# Patient Record
Sex: Female | Born: 1995 | Race: Black or African American | Hispanic: No | Marital: Single | Smoking: Never smoker
Health system: Southern US, Community
[De-identification: ages and names within clinical notes are randomized; demographics above are authoritative.]

## PROBLEM LIST (undated history)

## (undated) ENCOUNTER — Inpatient Hospital Stay (HOSPITAL_COMMUNITY): Payer: Self-pay

## (undated) DIAGNOSIS — E669 Obesity, unspecified: Secondary | ICD-10-CM

## (undated) HISTORY — PX: NO PAST SURGERIES: SHX2092

---

## 2013-03-13 ENCOUNTER — Emergency Department (HOSPITAL_COMMUNITY): Payer: Medicaid Other

## 2013-03-13 ENCOUNTER — Emergency Department (HOSPITAL_COMMUNITY)
Admission: EM | Admit: 2013-03-13 | Discharge: 2013-03-14 | Disposition: A | Discharge status: Home / Self Care | Payer: Medicaid Other | Attending: Emergency Medicine | Admitting: Emergency Medicine

## 2013-03-13 ENCOUNTER — Encounter (HOSPITAL_COMMUNITY): Payer: Self-pay | Admitting: Emergency Medicine

## 2013-03-13 DIAGNOSIS — O200 Threatened abortion: Secondary | ICD-10-CM

## 2013-03-13 DIAGNOSIS — E669 Obesity, unspecified: Secondary | ICD-10-CM | POA: Insufficient documentation

## 2013-03-13 HISTORY — DX: Obesity, unspecified: E66.9

## 2013-03-13 LAB — CBC WITH DIFFERENTIAL/PLATELET
Basophils Relative: 0 % (ref 0–1)
Eosinophils Absolute: 0.1 10*3/uL (ref 0.0–0.7)
Eosinophils Relative: 1 % (ref 0–5)
HCT: 35.4 % — ABNORMAL LOW (ref 36.0–46.0)
Hemoglobin: 12.3 g/dL (ref 12.0–15.0)
Lymphs Abs: 1.9 10*3/uL (ref 0.7–4.0)
MCH: 29.8 pg (ref 26.0–34.0)
MCHC: 34.7 g/dL (ref 30.0–36.0)
MCV: 85.7 fL (ref 78.0–100.0)
Monocytes Absolute: 0.5 10*3/uL (ref 0.1–1.0)
Monocytes Relative: 6 % (ref 3–12)
Neutrophils Relative %: 69 % (ref 43–77)
RBC: 4.13 MIL/uL (ref 3.87–5.11)

## 2013-03-13 LAB — URINALYSIS, ROUTINE W REFLEX MICROSCOPIC
Bilirubin Urine: NEGATIVE
Glucose, UA: NEGATIVE mg/dL
Ketones, ur: NEGATIVE mg/dL
Protein, ur: 30 mg/dL — AB
pH: 6.5 (ref 5.0–8.0)

## 2013-03-13 LAB — COMPREHENSIVE METABOLIC PANEL
AST: 17 U/L (ref 0–37)
Albumin: 3.6 g/dL (ref 3.5–5.2)
Alkaline Phosphatase: 61 U/L (ref 39–117)
BUN: 8 mg/dL (ref 6–23)
Creatinine, Ser: 0.62 mg/dL (ref 0.50–1.10)
GFR calc Af Amer: >90 mL/min (ref >90)
Glucose, Bld: 79 mg/dL (ref 70–99)
Potassium: 3.7 mEq/L (ref 3.5–5.1)
Total Bilirubin: 0.3 mg/dL (ref 0.3–1.2)
Total Protein: 7.5 g/dL (ref 6.0–8.3)

## 2013-03-13 LAB — POCT PREGNANCY, URINE: Preg Test, Ur: POSITIVE — AB

## 2013-03-13 LAB — URINE MICROSCOPIC-ADD ON

## 2013-03-13 LAB — HCG, QUANTITATIVE, PREGNANCY: hCG, Beta Chain, Quant, S: 12060 m[IU]/mL — ABNORMAL HIGH (ref <5)

## 2013-03-13 LAB — ABO/RH: ABO/RH(D): A POS

## 2013-03-13 NOTE — ED Notes (Signed)
Pt. reports vaginal spotting this evening with low abdominal cramping , pt. stated she is [redacted] weeks pregnant , LMP - 02/07/2013 ( G1P0 ) , no prenatal visits.

## 2013-03-13 NOTE — ED Provider Notes (Signed)
CSN: [id number]     Arrival date & time 03/13/13  1948 History   First MD Initiated Contact with Patient 03/13/13 2122     Chief Complaint  Patient presents with  . Vaginal Bleeding   (Consider location/radiation/quality/duration/timing/severity/associated sxs/prior Treatment) HPI Comments: Patient presents emergency department with chief complaint of vaginal bleeding. She states that she is about [redacted] weeks pregnant. This is her first pregnancy. She has not had any prenatal visits. She states that she started having some cramping and bleeding tonight. States the pain is moderate. She has not tried taking anything to alleviate her symptoms. Nothing makes her symptoms better or worse. She denies chest pain, shortness of breath, nausea, vomiting, diarrhea, or constipation. Denies any dysuria.  The history is provided by the patient. No language interpreter was used.    Past Medical History  Diagnosis Date  . Obesity    History reviewed. No pertinent past surgical history. No family history on file. History  Substance Use Topics  . Smoking status: Never Smoker   . Smokeless tobacco: Not on file  . Alcohol Use: No   OB History   Grav Para Term Preterm Abortions TAB SAB Ect Mult Living                 Review of Systems  All other systems reviewed and are negative.    Allergies  Review of patient's allergies indicates no known allergies.  Home Medications  No current outpatient prescriptions on file. BP 104/61  Pulse 89  Temp(Src) 98.5 F (36.9 C) (Oral)  Resp 16  Ht 5\' 5"  (1.651 m)  Wt 261 lb (118.389 kg)  BMI 43.43 kg/m2  SpO2 100%  LMP 02/07/2013 Physical Exam  Nursing note and vitals reviewed. Constitutional: She is oriented to person, place, and time. She appears well-developed and well-nourished.  HENT:  Head: Normocephalic and atraumatic.  Eyes: Conjunctivae and EOM are normal. Pupils are equal, round, and reactive to light.  Neck: Normal range of motion. Neck  supple.  Cardiovascular: Normal rate and regular rhythm.  Exam reveals no gallop and no friction rub.   No murmur heard. Pulmonary/Chest: Effort normal and breath sounds normal. No respiratory distress. She has no wheezes. She has no rales. She exhibits no tenderness.  Abdominal: Soft. Bowel sounds are normal. She exhibits no distension and no mass. There is no tenderness. There is no rebound and no guarding.  Musculoskeletal: Normal range of motion. She exhibits no edema and no tenderness.  Neurological: She is alert and oriented to person, place, and time.  Skin: Skin is warm and dry.  Psychiatric: She has a normal mood and affect. Her behavior is normal. Judgment and thought content normal.  Exam performed by Roxy Horseman,  exam chaperoned Date: 03/14/2013 Pelvic exam: normal external genitalia without evidence of trauma. VULVA: normal appearing vulva with no masses, tenderness or lesion. VAGINA: normal appearing vagina with normal color and discharge, no lesions. CERVIX: normal appearing cervix without lesions, cervical motion tenderness absent, cervical os closed with some purulent discharge/vaginal discharge  Wet prep and DNA probe for chlamydia and GC obtained.   ADNEXA: normal adnexa in size, nontender and no masses UTERUS: uterus is normal size, shape, consistency and nontender.    ED Course  Procedures (including critical care time) Results for orders placed during the hospital encounter of 03/13/13  URINALYSIS, ROUTINE W REFLEX MICROSCOPIC      Result Value Range   Color, Urine YELLOW  YELLOW   APPearance CLOUDY (*)  CLEAR   Specific Gravity, Urine 1.030  1.005 - 1.030   pH 6.5  5.0 - 8.0   Glucose, UA NEGATIVE  NEGATIVE mg/dL   Hgb urine dipstick NEGATIVE  NEGATIVE   Bilirubin Urine NEGATIVE  NEGATIVE   Ketones, ur NEGATIVE  NEGATIVE mg/dL   Protein, ur 30 (*) NEGATIVE mg/dL   Urobilinogen, UA 1.0  0.0 - 1.0 mg/dL   Nitrite NEGATIVE  NEGATIVE   Leukocytes, UA LARGE  (*) NEGATIVE  HCG, QUANTITATIVE, PREGNANCY      Result Value Range   hCG, Beta Chain, Quant, S 12060 (*) <5 mIU/mL  CBC WITH DIFFERENTIAL      Result Value Range   WBC 7.8  4.0 - 10.5 K/uL   RBC 4.13  3.87 - 5.11 MIL/uL   Hemoglobin 12.3  12.0 - 15.0 g/dL   HCT 16.1 (*) 09.6 - 04.5 %   MCV 85.7  78.0 - 100.0 fL   MCH 29.8  26.0 - 34.0 pg   MCHC 34.7  30.0 - 36.0 g/dL   RDW 40.9  81.1 - 91.4 %   Platelets 252  150 - 400 K/uL   Neutrophils Relative % 69  43 - 77 %   Neutro Abs 5.3  1.7 - 7.7 K/uL   Lymphocytes Relative 24  12 - 46 %   Lymphs Abs 1.9  0.7 - 4.0 K/uL   Monocytes Relative 6  3 - 12 %   Monocytes Absolute 0.5  0.1 - 1.0 K/uL   Eosinophils Relative 1  0 - 5 %   Eosinophils Absolute 0.1  0.0 - 0.7 K/uL   Basophils Relative 0  0 - 1 %   Basophils Absolute 0.0  0.0 - 0.1 K/uL  COMPREHENSIVE METABOLIC PANEL      Result Value Range   Sodium 136  135 - 145 mEq/L   Potassium 3.7  3.5 - 5.1 mEq/L   Chloride 103  96 - 112 mEq/L   CO2 22  19 - 32 mEq/L   Glucose, Bld 79  70 - 99 mg/dL   BUN 8  6 - 23 mg/dL   Creatinine, Ser 7.82  0.50 - 1.10 mg/dL   Calcium 9.0  8.4 - 95.6 mg/dL   Total Protein 7.5  6.0 - 8.3 g/dL   Albumin 3.6  3.5 - 5.2 g/dL   AST 17  0 - 37 U/L   ALT 21  0 - 35 U/L   Alkaline Phosphatase 61  39 - 117 U/L   Total Bilirubin 0.3  0.3 - 1.2 mg/dL   GFR calc non Af Amer >90  >90 mL/min   GFR calc Af Amer >90  >90 mL/min  URINE MICROSCOPIC-ADD ON      Result Value Range   Squamous Epithelial / LPF MANY (*) RARE   WBC, UA 21-50  <3 WBC/hpf   RBC / HPF 3-6  <3 RBC/hpf   Bacteria, UA MANY (*) RARE   Sperm, UA PRESENT     Urine-Other MUCOUS PRESENT    POCT PREGNANCY, URINE      Result Value Range   Preg Test, Ur POSITIVE (*) NEGATIVE  ABO/RH      Result Value Range   ABO/RH(D) A POS     No rh immune globuloin NOT A RH IMMUNE GLOBULIN CANDIDATE, PT RH POSITIVE     US Ob Comp Less 14 Wks  03/13/2013   CLINICAL DATA:  Vaginal bleeding.  EXAM:  TRANSVAGINAL OB ULTRASOUND; OBSTETRIC <  14 WK ULTRASOUND  TECHNIQUE: Transvaginal ultrasound was performed for complete evaluation of the gestation as well as the maternal uterus, adnexal regions, and pelvic cul-de-sac.  COMPARISON:  No priors.  FINDINGS: Intrauterine gestational sac: Single ovoid shaped gestational sac in the fundal portion of the endometrial cavity.  Yolk sac:  Present.  Embryo:  Present.  Cardiac Activity: Present.  Heart Rate: 85 bpm  MSD:   mm    w     d  CRL:   2.3  mm   5 w 5 d                  [place] EDC: 11/08/2013  Maternal uterus/adnexae: No evidence of subchorionic hemorrhage at this time. Probable degenerating corpus luteum cyst in the right ovary incidentally noted. Left ovary is unremarkable in appearance. Small volume of free fluid in the cul-de-sac.  IMPRESSION: 1. Single viable IUP with estimated gestational age of [redacted] weeks and 5 days and fetal heart rate of 85 beats per min. 2. Probable degenerating corpus luteum cyst in the right ovary incidentally noted. 3. Small volume of free fluid in the cul-de-sac is presumably physiologic.   Electronically Signed   By: Trudie Reed M.D.   On: 03/13/2013 22:47   US Ob Transvaginal  03/13/2013   CLINICAL DATA:  Vaginal bleeding.  EXAM: TRANSVAGINAL OB ULTRASOUND; OBSTETRIC <14 WK ULTRASOUND  TECHNIQUE: Transvaginal ultrasound was performed for complete evaluation of the gestation as well as the maternal uterus, adnexal regions, and pelvic cul-de-sac.  COMPARISON:  No priors.  FINDINGS: Intrauterine gestational sac: Single ovoid shaped gestational sac in the fundal portion of the endometrial cavity.  Yolk sac:  Present.  Embryo:  Present.  Cardiac Activity: Present.  Heart Rate: 85 bpm  MSD:   mm    w     d  CRL:   2.3  mm   5 w 5 d                  [place] EDC: 11/08/2013  Maternal uterus/adnexae: No evidence of subchorionic hemorrhage at this time. Probable degenerating corpus luteum cyst in the right ovary incidentally noted. Left ovary is  unremarkable in appearance. Small volume of free fluid in the cul-de-sac.  IMPRESSION: 1. Single viable IUP with estimated gestational age of [redacted] weeks and 5 days and fetal heart rate of 85 beats per min. 2. Probable degenerating corpus luteum cyst in the right ovary incidentally noted. 3. Small volume of free fluid in the cul-de-sac is presumably physiologic.   Electronically Signed   By: Trudie Reed M.D.   On: 03/13/2013 22:47      EKG Interpretation   None       MDM   1. Threatened abortion     Pregnant patient with vaginal bleeding. Will check labs, ultrasound, and pelvic exam.  US shows viable IUP and probably corpus luteum cyst.    This is the first pregnancy for the patient. Rh is positive. Not candidate for RhoGAM.  Concern for threatened abortion.  12:35 AM Patient discussed with Dr. Despina Hidden, who recommends follow-up with [facility] in 2 weeks. Discussed with Dr. Effie Shy, who agrees with this plan.  Roxy Horseman, PA-C 03/14/13 0039  Roxy Horseman, PA-C 03/14/13 1478

## 2013-03-14 LAB — WET PREP, GENITAL

## 2013-03-14 LAB — GC/CHLAMYDIA PROBE AMP
CT Probe RNA: NEGATIVE
GC Probe RNA: NEGATIVE

## 2013-03-14 NOTE — ED Provider Notes (Signed)
Medical screening examination/treatment/procedure(s) were performed by non-physician practitioner and as supervising physician I was immediately available for consultation/collaboration.  EKG Interpretation   None        Nessa Ramaker L Brek Reece, MD 03/14/13 1143 

## 2013-03-15 LAB — URINE CULTURE

## 2013-03-24 ENCOUNTER — Encounter (HOSPITAL_COMMUNITY): Payer: Self-pay

## 2013-03-24 ENCOUNTER — Inpatient Hospital Stay (HOSPITAL_COMMUNITY)
Admission: AD | Admit: 2013-03-24 | Discharge: 2013-03-24 | Disposition: A | Discharge status: Home / Self Care | Payer: Medicaid Other | Source: Ambulatory Visit | Attending: Obstetrics & Gynecology | Admitting: Obstetrics & Gynecology

## 2013-03-24 DIAGNOSIS — O210 Mild hyperemesis gravidarum: Secondary | ICD-10-CM | POA: Insufficient documentation

## 2013-03-24 LAB — URINALYSIS, ROUTINE W REFLEX MICROSCOPIC
Leukocytes, UA: NEGATIVE
Protein, ur: NEGATIVE mg/dL
Specific Gravity, Urine: 1.02 (ref 1.005–1.030)
Urobilinogen, UA: 0.2 mg/dL (ref 0.0–1.0)

## 2013-03-24 MED ORDER — ONDANSETRON 8 MG PO TBDP: 8.0000 mg | ORAL_TABLET | Freq: Once | ORAL | Status: DC

## 2013-03-24 MED ORDER — PROMETHAZINE HCL 25 MG PO TABS: 25.0000 mg | ORAL_TABLET | Freq: Four times a day (QID) | ORAL | Status: DC | PRN

## 2013-03-24 MED ORDER — ONDANSETRON 8 MG PO TBDP
8.0000 mg | ORAL_TABLET | Freq: Once | ORAL | Status: AC
  Administered 2013-03-24: 8 mg via ORAL
  Filled 2013-03-24: qty 1

## 2013-03-24 MED ORDER — ONDANSETRON 8 MG PO TBDP: 8.0000 mg | ORAL_TABLET | Freq: Three times a day (TID) | ORAL | Status: DC | PRN

## 2013-03-24 NOTE — MAU Provider Note (Signed)
  History     CSN: [id number]  Arrival date and time: 03/24/13 1529   None     Chief Complaint  Patient presents with  . Hyperemesis Gravidarum   HPI RN; Pt last attempted food at 1400, vomited. Unable to keep foods or drink down  Pt was seen on 03/13/2013 in the ED with confimed single viable IUP. Pt denies spotting or bleeding or UTI sx. Pt has not taken any medications for her nausea and vomiting Pt had vomiting one time today     Past Medical History  Diagnosis Date  . Obesity     History reviewed. No pertinent past surgical history.  Family History  Problem Relation Age of Onset  . Hypertension Father   . Diabetes Maternal Aunt   . Diabetes Maternal Uncle   . Diabetes Maternal Grandmother     History  Substance Use Topics  . Smoking status: Never Smoker   . Smokeless tobacco: Not on file  . Alcohol Use: No    Allergies: No Known Allergies  No prescriptions prior to admission    Review of Systems  Constitutional: Negative for fever and chills.  Gastrointestinal: Positive for nausea and vomiting. Negative for abdominal pain, diarrhea and constipation.  Genitourinary: Negative for dysuria.   Physical Exam   Last menstrual period 02/07/2013.  Physical Exam  Nursing note and vitals reviewed. Constitutional: She appears well-developed and well-nourished. No distress.  HENT:  Head: Normocephalic.  Eyes: Pupils are equal, round, and reactive to light.  Neck: Normal range of motion. Neck supple.  Cardiovascular:  Pulse 127 bpm  Respiratory: Effort normal.  GI: Soft.  Musculoskeletal: Normal range of motion.  Neurological: She is alert.  Skin: Skin is warm and dry.  Psychiatric: She has a normal mood and affect.    MAU Course  Procedures Results for orders placed during the hospital encounter of 03/24/13 (from the past 24 hour(s))  URINALYSIS, ROUTINE W REFLEX MICROSCOPIC     Status: None   Collection Time    03/24/13  3:52 PM      Result  Value Range   Color, Urine YELLOW  YELLOW   APPearance CLEAR  CLEAR   Specific Gravity, Urine 1.020  1.005 - 1.030   pH 8.0  5.0 - 8.0   Glucose, UA NEGATIVE  NEGATIVE mg/dL   Hgb urine dipstick NEGATIVE  NEGATIVE   Bilirubin Urine NEGATIVE  NEGATIVE   Ketones, ur NEGATIVE  NEGATIVE mg/dL   Protein, ur NEGATIVE  NEGATIVE mg/dL   Urobilinogen, UA 0.2  0.0 - 1.0 mg/dL   Nitrite NEGATIVE  NEGATIVE   Leukocytes, UA NEGATIVE  NEGATIVE  pt given Zofran 8mg  ODT and sx of nausea resolved Pt able to tolerate peanut butter and crackers and juice  Assessment and Plan  Morning sickness- Rx for Zofran and phenergan Small frequent meals; diet recommendations given F/u with [facility] for prenatal care  Nayib Remer 03/24/2013, 4:21 PM

## 2013-03-24 NOTE — MAU Note (Signed)
Pt last attempted food at 1400, vomited. Unable to keep foods or drink down.

## 2013-03-24 NOTE — MAU Note (Signed)
Pt states here for n/v. Had positive upt at home earlier this month. EDD via U/s at [facility] 11/08/2012. Has had n/v with pregnancy. Not taking any medications. Denies bleeding or vag d/c changes.

## 2013-03-28 ENCOUNTER — Emergency Department (HOSPITAL_COMMUNITY)
Admission: EM | Admit: 2013-03-28 | Discharge: 2013-03-28 | Disposition: A | Discharge status: Home / Self Care | Payer: Medicaid Other | Attending: Emergency Medicine | Admitting: Emergency Medicine

## 2013-03-28 ENCOUNTER — Encounter (HOSPITAL_COMMUNITY): Payer: Self-pay | Admitting: Emergency Medicine

## 2013-03-28 DIAGNOSIS — O210 Mild hyperemesis gravidarum: Secondary | ICD-10-CM

## 2013-03-28 DIAGNOSIS — E669 Obesity, unspecified: Secondary | ICD-10-CM | POA: Insufficient documentation

## 2013-03-28 LAB — COMPREHENSIVE METABOLIC PANEL
ALT: 17 U/L (ref 0–35)
AST: 15 U/L (ref 0–37)
Alkaline Phosphatase: 58 U/L (ref 39–117)
CO2: 24 mEq/L (ref 19–32)
Calcium: 9.2 mg/dL (ref 8.4–10.5)
Chloride: 100 mEq/L (ref 96–112)
GFR calc non Af Amer: >90 mL/min (ref >90)
Glucose, Bld: 88 mg/dL (ref 70–99)
Potassium: 3.3 mEq/L — ABNORMAL LOW (ref 3.5–5.1)
Sodium: 135 mEq/L (ref 135–145)
Total Bilirubin: 0.6 mg/dL (ref 0.3–1.2)

## 2013-03-28 LAB — CBC WITH DIFFERENTIAL/PLATELET
Basophils Absolute: 0 10*3/uL (ref 0.0–0.1)
Lymphocytes Relative: 15 % (ref 12–46)
Lymphs Abs: 1.3 10*3/uL (ref 0.7–4.0)
Monocytes Relative: 7 % (ref 3–12)
Neutro Abs: 6.5 10*3/uL (ref 1.7–7.7)
Neutrophils Relative %: 77 % (ref 43–77)
Platelets: 240 10*3/uL (ref 150–400)
RBC: 3.93 MIL/uL (ref 3.87–5.11)
RDW: 13.2 % (ref 11.5–15.5)
WBC: 8.5 10*3/uL (ref 4.0–10.5)

## 2013-03-28 MED ORDER — SODIUM CHLORIDE 0.9 % IV BOLUS (SEPSIS)
1000.0000 mL | Freq: Once | INTRAVENOUS | Status: AC
  Administered 2013-03-28: 1000 mL via INTRAVENOUS

## 2013-03-28 MED ORDER — PROMETHAZINE HCL 25 MG PO TABS: 25.0000 mg | ORAL_TABLET | Freq: Four times a day (QID) | ORAL | Status: DC | PRN

## 2013-03-28 MED ORDER — ONDANSETRON 4 MG PO TBDP: ORAL_TABLET | ORAL | Status: DC

## 2013-03-28 MED ORDER — ONDANSETRON HCL 4 MG/2ML IJ SOLN
4.0000 mg | Freq: Once | INTRAMUSCULAR | Status: AC
  Administered 2013-03-28: 4 mg via INTRAVENOUS
  Filled 2013-03-28: qty 2

## 2013-03-28 NOTE — ED Provider Notes (Signed)
CSN: [id number]     Arrival date & time 03/28/13  4098 History   First MD Initiated Contact with Patient 03/28/13 0322     Chief Complaint  Patient presents with  . Emesis During Pregnancy   (Consider location/radiation/quality/duration/timing/severity/associated sxs/prior Treatment) HPI Patient is a G1 P0 at roughly 7 weeks by last menstrual period. She has a confirmed IUP by ultrasound. She's been seen at the women's clinic several times for hypertension gravidarum and has been given a prescription for Phenergan and Zofran. She states she has been unable to afford his prescriptions. She presents with persistent vomiting and not being able to tolerate anything by mouth. She denies lightheadedness, abdominal pain, diarrhea, vaginal bleeding, vaginal discharge. Past Medical History  Diagnosis Date  . Obesity    History reviewed. No pertinent past surgical history. Family History  Problem Relation Age of Onset  . Hypertension Father   . Diabetes Maternal Aunt   . Diabetes Maternal Uncle   . Diabetes Maternal Grandmother    History  Substance Use Topics  . Smoking status: Never Smoker   . Smokeless tobacco: Not on file  . Alcohol Use: No   OB History   Grav Para Term Preterm Abortions TAB SAB Ect Mult Living   1              Review of Systems  Constitutional: Negative for fever and chills.  Respiratory: Negative for shortness of breath.   Cardiovascular: Negative for chest pain.  Gastrointestinal: Positive for nausea and vomiting. Negative for abdominal pain, diarrhea, constipation and blood in stool.  Genitourinary: Negative for dysuria, flank pain, vaginal bleeding, vaginal discharge and pelvic pain.  Musculoskeletal: Negative for back pain.  Skin: Negative for rash.  Neurological: Negative for dizziness, syncope, weakness, light-headedness, numbness and headaches.  All other systems reviewed and are negative.    Allergies  Review of patient's allergies indicates no  known allergies.  Home Medications   Current Outpatient Rx  Name  Route  Sig  Dispense  Refill  . Prenatal Vit-Fe Fumarate-FA (PRENATAL MULTIVITAMIN) TABS tablet   Oral   Take 1 tablet by mouth daily at 12 noon.         . ondansetron (ZOFRAN ODT) 8 MG disintegrating tablet   Oral   Take 1 tablet (8 mg total) by mouth every 8 (eight) hours as needed for nausea or vomiting.   20 tablet   0   . promethazine (PHENERGAN) 25 MG tablet   Oral   Take 1 tablet (25 mg total) by mouth every 6 (six) hours as needed for nausea or vomiting.   30 tablet   0    BP 113/47  Pulse 71  Temp(Src) 99.1 F (37.3 C) (Oral)  Resp 18  SpO2 100%  LMP 02/07/2013 Physical Exam  Nursing note and vitals reviewed. Constitutional: She is oriented to person, place, and time. She appears well-developed and well-nourished. No distress.  HENT:  Head: Normocephalic and atraumatic.  Mouth/Throat: Oropharynx is clear and moist.  Eyes: EOM are normal. Pupils are equal, round, and reactive to light.  Neck: Normal range of motion. Neck supple.  Cardiovascular: Normal rate and regular rhythm.   Pulmonary/Chest: Effort normal and breath sounds normal. No respiratory distress. She has no wheezes. She has no rales.  Abdominal: Soft. Bowel sounds are normal. She exhibits no distension and no mass. There is no tenderness. There is no rebound and no guarding.  Musculoskeletal: Normal range of motion. She exhibits no edema and  no tenderness.  Neurological: She is alert and oriented to person, place, and time.  Skin: Skin is warm and dry. No rash noted. No erythema.  Psychiatric: She has a normal mood and affect. Her behavior is normal.    ED Course  Procedures (including critical care time) Labs Review Labs Reviewed  CBC WITH DIFFERENTIAL - Abnormal; Notable for the following:    Hemoglobin 11.4 (*)    HCT 33.4 (*)    All other components within normal limits  COMPREHENSIVE METABOLIC PANEL  URINALYSIS,  ROUTINE W REFLEX MICROSCOPIC   Imaging Review No results found.  EKG Interpretation   None       MDM    Patient states she is feeling much better after the IV fluids. She's had no further vomiting in the emergency department. I have stressed to her the need to have her nausea medications filled. Return precautions have been given.  Loren Racer, MD 03/28/13 [phone]

## 2013-03-28 NOTE — ED Notes (Addendum)
Pt. reports persistent nausea/vomitting onset this evening , pt. stated she is [redacted] weeks pregnant ( G1P0 ) . No prenatal visit.

## 2013-03-30 ENCOUNTER — Inpatient Hospital Stay (HOSPITAL_COMMUNITY)
Admission: AD | Admit: 2013-03-30 | Discharge: 2013-03-30 | Disposition: A | Discharge status: Home / Self Care | Payer: Medicaid Other | Source: Ambulatory Visit | Attending: Obstetrics & Gynecology | Admitting: Obstetrics & Gynecology

## 2013-03-30 ENCOUNTER — Inpatient Hospital Stay (HOSPITAL_COMMUNITY): Payer: Medicaid Other

## 2013-03-30 ENCOUNTER — Encounter (HOSPITAL_COMMUNITY): Payer: Self-pay | Admitting: Family

## 2013-03-30 DIAGNOSIS — R109 Unspecified abdominal pain: Secondary | ICD-10-CM | POA: Insufficient documentation

## 2013-03-30 DIAGNOSIS — O209 Hemorrhage in early pregnancy, unspecified: Secondary | ICD-10-CM | POA: Insufficient documentation

## 2013-03-30 DIAGNOSIS — N760 Acute vaginitis: Secondary | ICD-10-CM

## 2013-03-30 LAB — URINALYSIS, ROUTINE W REFLEX MICROSCOPIC
Leukocytes, UA: NEGATIVE
Nitrite: NEGATIVE
Specific Gravity, Urine: 1.025 (ref 1.005–1.030)
pH: 8 (ref 5.0–8.0)

## 2013-03-30 LAB — URINE MICROSCOPIC-ADD ON

## 2013-03-30 MED ORDER — FLUCONAZOLE 150 MG PO TABS
150.0000 mg | ORAL_TABLET | Freq: Once | ORAL | Status: AC
  Administered 2013-03-30: 150 mg via ORAL
  Filled 2013-03-30: qty 1

## 2013-03-30 NOTE — Progress Notes (Signed)
Written and verbal d/c instructions given and understanding voiced. 

## 2013-03-30 NOTE — MAU Provider Note (Signed)
Chief Complaint: No chief complaint on file.   First Provider Initiated Contact with Patient 03/30/13 1923     SUBJECTIVE HPI: Christine Stanton is a 17 y.o. G1P0 at [redacted]w[redacted]d by LMP who presents to maternity admissions reporting vaginal bleeding and abdominal cramping starting last night.  Her pain is described as intermittent cramping, like severe menstrual cramps, and bleeding is described as reddish brown when she wipes and in her underwear, not enough to require a pad.  Pt reports last intercourse was within 24 hours.  She also reports vaginal itching x several weeks and notes that the ED at [facility] treated her for chlamydia/gonorrhea based on her pelvic exam but her tests came back negative.  She denies vaginal discharge, urinary symptoms, h/a, dizziness, n/v, or fever/chills.   Past Medical History  Diagnosis Date  . Obesity    Past Surgical History  Procedure Laterality Date  . No past surgeries     History   Social History  . Marital Status: Single    Spouse Name: N/A    Number of Children: N/A  . Years of Education: N/A   Occupational History  . Not on file.   Social History Main Topics  . Smoking status: Never Smoker   . Smokeless tobacco: Not on file  . Alcohol Use: No  . Drug Use: No  . Sexual Activity: Yes   Other Topics Concern  . Not on file   Social History Narrative  . No narrative on file   No current facility-administered medications on file prior to encounter.   Current Outpatient Prescriptions on File Prior to Encounter  Medication Sig Dispense Refill  . ondansetron (ZOFRAN ODT) 8 MG disintegrating tablet Take 1 tablet (8 mg total) by mouth every 8 (eight) hours as needed for nausea or vomiting.  20 tablet  0   No Known Allergies  ROS: Pertinent items in HPI  OBJECTIVE Blood pressure 97/53, pulse 84, temperature 99.1 F (37.3 C), temperature source Oral, resp. rate 18, height 5\' 5"  (1.651 m), weight 118.389 kg (261 lb), last menstrual period  02/07/2013. GENERAL: Well-developed, well-nourished female in no acute distress.  HEENT: Normocephalic HEART: normal rate RESP: normal effort ABDOMEN: Soft, non-tender, no rebound tenderness, no guarding EXTREMITIES: Nontender, no edema NEURO: Alert and oriented Pelvic exam: Cervix pink, visually closed, without lesion, small amount light brown vaginal discharge, vaginal walls and external genitalia normal Bimanual exam: Cervix 0/long/high, firm, anterior, neg CMT, uterus nontender, ~8 week size, adnexa not palpable due to body habitus but mild tenderness in RLQ  LAB RESULTS Results for orders placed during the hospital encounter of 03/30/13 (from the past 24 hour(s))  URINALYSIS, ROUTINE W REFLEX MICROSCOPIC     Status: Abnormal   Collection Time    03/30/13  5:45 PM      Result Value Range   Color, Urine YELLOW  YELLOW   APPearance CLEAR  CLEAR   Specific Gravity, Urine 1.025  1.005 - 1.030   pH 8.0  5.0 - 8.0   Glucose, UA NEGATIVE  NEGATIVE mg/dL   Hgb urine dipstick SMALL (*) NEGATIVE   Bilirubin Urine NEGATIVE  NEGATIVE   Ketones, ur NEGATIVE  NEGATIVE mg/dL   Protein, ur NEGATIVE  NEGATIVE mg/dL   Urobilinogen, UA 0.2  0.0 - 1.0 mg/dL   Nitrite NEGATIVE  NEGATIVE   Leukocytes, UA NEGATIVE  NEGATIVE  URINE MICROSCOPIC-ADD ON     Status: Abnormal   Collection Time    03/30/13  5:45 PM  Result Value Range   Squamous Epithelial / LPF FEW (*) RARE   WBC, UA 0-2  <3 WBC/hpf   RBC / HPF 0-2  <3 RBC/hpf   Bacteria, UA FEW (*) RARE    IMAGING US Ob Comp Less 14 Wks  03/13/2013   CLINICAL DATA:  Vaginal bleeding.  EXAM: TRANSVAGINAL OB ULTRASOUND; OBSTETRIC <14 WK ULTRASOUND  TECHNIQUE: Transvaginal ultrasound was performed for complete evaluation of the gestation as well as the maternal uterus, adnexal regions, and pelvic cul-de-sac.  COMPARISON:  No priors.  FINDINGS: Intrauterine gestational sac: Single ovoid shaped gestational sac in the fundal portion of the endometrial  cavity.  Yolk sac:  Present.  Embryo:  Present.  Cardiac Activity: Present.  Heart Rate: 85 bpm  MSD:   mm    w     d  CRL:   2.3  mm   5 w 5 d                  [place] EDC: 11/08/2013  Maternal uterus/adnexae: No evidence of subchorionic hemorrhage at this time. Probable degenerating corpus luteum cyst in the right ovary incidentally noted. Left ovary is unremarkable in appearance. Small volume of free fluid in the cul-de-sac.  IMPRESSION: 1. Single viable IUP with estimated gestational age of [redacted] weeks and 5 days and fetal heart rate of 85 beats per min. 2. Probable degenerating corpus luteum cyst in the right ovary incidentally noted. 3. Small volume of free fluid in the cul-de-sac is presumably physiologic.   Electronically Signed   By: Trudie Reed M.D.   On: 03/13/2013 22:47    Report to Sid Falcon, [place]   Sharen Counter Certified Nurse-Midwife 03/30/2013  7:46 PM

## 2013-03-30 NOTE — MAU Note (Signed)
Patient reports no blood seen when in MAU BR just now.

## 2013-03-30 NOTE — MAU Note (Signed)
17 yo, G1P0 at [redacted]w[redacted]d, presents to MAU with c/o bilateral lower abdominal cramping since yesterday and post-coital spotting since 1645 today.

## 2013-03-30 NOTE — MAU Provider Note (Signed)
Attestation of Attending Supervision of Advanced Practitioner (PA/CNM/NP): Evaluation and management procedures were performed by the Advanced Practitioner under my supervision and collaboration.  I have reviewed the Advanced Practitioner's note and chart, and I agree with the management and plan.  Tito Ausmus, MD, FACOG Attending Obstetrician & Gynecologist Faculty Practice, [facility]  

## 2013-05-26 ENCOUNTER — Inpatient Hospital Stay (HOSPITAL_COMMUNITY)
Admission: AD | Admit: 2013-05-26 | Discharge: 2013-05-26 | Disposition: A | Discharge status: Home / Self Care | Payer: Medicaid Other | Source: Ambulatory Visit | Attending: Obstetrics & Gynecology | Admitting: Obstetrics & Gynecology

## 2013-05-26 ENCOUNTER — Encounter (HOSPITAL_COMMUNITY): Payer: Self-pay

## 2013-05-26 DIAGNOSIS — N949 Unspecified condition associated with female genital organs and menstrual cycle: Secondary | ICD-10-CM

## 2013-05-26 DIAGNOSIS — M549 Dorsalgia, unspecified: Secondary | ICD-10-CM | POA: Insufficient documentation

## 2013-05-26 DIAGNOSIS — R109 Unspecified abdominal pain: Secondary | ICD-10-CM | POA: Insufficient documentation

## 2013-05-26 DIAGNOSIS — N898 Other specified noninflammatory disorders of vagina: Secondary | ICD-10-CM | POA: Insufficient documentation

## 2013-05-26 DIAGNOSIS — A5901 Trichomonal vulvovaginitis: Secondary | ICD-10-CM | POA: Insufficient documentation

## 2013-05-26 DIAGNOSIS — A599 Trichomoniasis, unspecified: Secondary | ICD-10-CM

## 2013-05-26 DIAGNOSIS — O98819 Other maternal infectious and parasitic diseases complicating pregnancy, unspecified trimester: Secondary | ICD-10-CM | POA: Insufficient documentation

## 2013-05-26 LAB — URINE MICROSCOPIC-ADD ON

## 2013-05-26 LAB — URINALYSIS, ROUTINE W REFLEX MICROSCOPIC
Bilirubin Urine: NEGATIVE
GLUCOSE, UA: NEGATIVE mg/dL
Hgb urine dipstick: NEGATIVE
KETONES UR: 15 mg/dL — AB
LEUKOCYTES UA: NEGATIVE
NITRITE: NEGATIVE
Protein, ur: NEGATIVE mg/dL
Urobilinogen, UA: 0.2 mg/dL (ref 0.0–1.0)
pH: 6 (ref 5.0–8.0)

## 2013-05-26 LAB — WET PREP, GENITAL
CLUE CELLS WET PREP: NONE SEEN
YEAST WET PREP: NONE SEEN

## 2013-05-26 MED ORDER — METRONIDAZOLE 500 MG PO TABS
2000.0000 mg | ORAL_TABLET | Freq: Once | ORAL | Status: AC
  Administered 2013-05-26: 2000 mg via ORAL
  Filled 2013-05-26: qty 4

## 2013-05-26 MED ORDER — PROMETHAZINE HCL 25 MG PO TABS: 25.0000 mg | ORAL_TABLET | Freq: Four times a day (QID) | ORAL | Status: DC | PRN

## 2013-05-26 MED ORDER — PROMETHAZINE HCL 25 MG PO TABS
25.0000 mg | ORAL_TABLET | Freq: Once | ORAL | Status: AC
  Administered 2013-05-26: 25 mg via ORAL
  Filled 2013-05-26: qty 1

## 2013-05-26 NOTE — MAU Provider Note (Signed)
History     CSN: [id number]  Arrival date and time: 05/26/13 1300   First Provider Initiated Contact with Patient 05/26/13 1405      Chief Complaint  Patient presents with  . Back Pain  . Vaginal Discharge  . pain right lower side    HPI Comments: Christine Stanton 18 y.o. G1P0 presents to MAU with 2 complaints. First is vaginal discharge and second is what sounds like round ligament pains. [place] in [place] but partner stays in [place].       Back Pain Associated symptoms include abdominal pain.  Vaginal Discharge The patient's primary symptoms include a vaginal discharge. Associated symptoms include abdominal pain and back pain.      Past Medical History  Diagnosis Date  . Obesity     Past Surgical History  Procedure Laterality Date  . No past surgeries      Family History  Problem Relation Age of Onset  . Hypertension Father   . Diabetes Maternal Aunt   . Diabetes Maternal Uncle   . Diabetes Maternal Grandmother     History  Substance Use Topics  . Smoking status: Never Smoker   . Smokeless tobacco: Not on file  . Alcohol Use: No    Allergies: No Known Allergies  Prescriptions prior to admission  Medication Sig Dispense Refill  . Prenatal Vit-Fe Fumarate-FA (PRENATAL MULTIVITAMIN) TABS tablet Take 1 tablet by mouth daily at 12 noon.        Review of Systems  Constitutional: Negative.   HENT: Negative.   Gastrointestinal: Positive for abdominal pain.  Genitourinary: Positive for vaginal discharge.       Vaginal discharge  Musculoskeletal: Positive for back pain.  Skin: Negative.   Neurological: Negative.   Psychiatric/Behavioral: Negative.    Physical Exam   Blood pressure 126/69, pulse 102, temperature 99.1 F (37.3 C), resp. rate 18, height 5\' 4"  (1.626 m), weight 108.41 kg (239 lb), last menstrual period 02/07/2013.  Physical Exam  Constitutional: She appears well-developed and well-nourished. No distress.   HENT:  Head: Normocephalic.  GI: Soft. Bowel sounds are normal. There is tenderness.  At round ligament sites   Genitourinary:  Genital: external negative Vaginal: small amount white discharge with odor Cervix: closed and long Bimanual: tender at ligament sites    Results for orders placed during the hospital encounter of 05/26/13 (from the past 24 hour(s))  URINALYSIS, ROUTINE W REFLEX MICROSCOPIC     Status: Abnormal   Collection Time    05/26/13  1:25 PM      Result Value Range   Color, Urine YELLOW  YELLOW   APPearance TURBID (*) CLEAR   Specific Gravity, Urine >1.030 (*) 1.005 - 1.030   pH 6.0  5.0 - 8.0   Glucose, UA NEGATIVE  NEGATIVE mg/dL   Hgb urine dipstick NEGATIVE  NEGATIVE   Bilirubin Urine NEGATIVE  NEGATIVE   Ketones, ur 15 (*) NEGATIVE mg/dL   Protein, ur NEGATIVE  NEGATIVE mg/dL   Urobilinogen, UA 0.2  0.0 - 1.0 mg/dL   Nitrite NEGATIVE  NEGATIVE   Leukocytes, UA NEGATIVE  NEGATIVE  URINE MICROSCOPIC-ADD ON     Status: Abnormal   Collection Time    05/26/13  1:25 PM      Result Value Range   Squamous Epithelial / LPF RARE  RARE   WBC, UA 3-6  <3 WBC/hpf   Crystals CA OXALATE CRYSTALS (*) NEGATIVE   Urine-Other MUCOUS PRESENT    WET  PREP, GENITAL     Status: Abnormal   Collection Time    05/26/13  2:50 PM      Result Value Range   Yeast Wet Prep HPF POC NONE SEEN  NONE SEEN   Trich, Wet Prep FEW (*) NONE SEEN   Clue Cells Wet Prep HPF POC NONE SEEN  NONE SEEN   WBC, Wet Prep HPF POC FEW (*) NONE SEEN     MAU Course  Procedures  MDM  Wet prep, GC, chlamydia Samples were improperly labeled and had to be recollected Assessment and Plan   A: Round ligament pains Trich  P: Discussed maternity belt, tylenol, warm baths, rest, follow up with OBGYN Flagyl 2 Grams po now with phenergan 25 mg Partner must be treated   Carolynn Serve 05/26/2013, 2:26 PM

## 2013-05-26 NOTE — Discharge Instructions (Signed)
Trichomoniasis °Trichomoniasis is an infection, caused by the Trichomonas organism, that affects both women and men. In women, the outer female genitalia and the vagina are affected. In men, the penis is mainly affected, but the prostate and other reproductive organs can also be involved. Trichomoniasis is a sexually transmitted disease (STD) and is most often passed to another person through sexual contact. The majority of people who get trichomoniasis do so from a sexual encounter and are also at risk for other STDs. °CAUSES  °· Sexual intercourse with an infected partner. °· It can be present in swimming pools or hot tubs. °SYMPTOMS  °· Abnormal gray-green frothy vaginal discharge in women. °· Vaginal itching and irritation in women. °· Itching and irritation of the area outside the vagina in women. °· Penile discharge with or without pain in males. °· Inflammation of the urethra (urethritis), causing painful urination. °· Bleeding after sexual intercourse. °RELATED COMPLICATIONS °· Pelvic inflammatory disease. °· Infection of the uterus (endometritis). °· Infertility. °· Tubal (ectopic) pregnancy. °· It can be associated with other STDs, including gonorrhea and chlamydia, hepatitis B, and HIV. °COMPLICATIONS DURING PREGNANCY °· Early (premature) delivery. °· Premature rupture of the membranes (PROM). °· Low birth weight. °DIAGNOSIS  °· Visualization of Trichomonas under the microscope from the vagina discharge. °· Ph of the vagina greater than 4.5, tested with a test tape. °· Trich Rapid Test. °· Culture of the organism, but this is not usually needed. °· It may be found on a Pap test. °· Having a "strawberry cervix,"which means the cervix looks very red like a strawberry. °TREATMENT  °· You may be given medication to fight the infection. Inform your caregiver if you could be or are pregnant. Some medications used to treat the infection should not be taken during pregnancy. °· Over-the-counter medications or  creams to decrease itching or irritation may be recommended. °· Your sexual partner will need to be treated if infected. °HOME CARE INSTRUCTIONS  °· Take all medication prescribed by your caregiver. °· Take over-the-counter medication for itching or irritation as directed by your caregiver. °· Do not have sexual intercourse while you have the infection. °· Do not douche or wear tampons. °· Discuss your infection with your partner, as your partner may have acquired the infection from you. Or, your partner may have been the person who transmitted the infection to you. °· Have your sex partner examined and treated if necessary. °· Practice safe, informed, and protected sex. °· See your caregiver for other STD testing. °SEEK MEDICAL CARE IF:  °· You still have symptoms after you finish the medication. °· You have an oral temperature above 102° F (38.9° C). °· You develop belly (abdominal) pain. °· You have pain when you urinate. °· You have bleeding after sexual intercourse. °· You develop a rash. °· The medication makes you sick or makes you throw up (vomit). °Document Released: 10/19/2000 Document Revised: 07/18/2011 Document Reviewed: 11/14/2008 °[employer]® Patient Information ©2014 [employer]. ° °

## 2013-05-26 NOTE — MAU Note (Signed)
Pt presents with complaints of pain in her back and in her right side with vaginal discharge for 2 weeks. [facility] that [facility] [place].

## 2013-05-27 LAB — GC/CHLAMYDIA PROBE AMP
CT Probe RNA: NEGATIVE
GC Probe RNA: NEGATIVE

## 2013-06-23 ENCOUNTER — Encounter (HOSPITAL_COMMUNITY): Payer: Self-pay

## 2013-06-23 ENCOUNTER — Inpatient Hospital Stay (HOSPITAL_COMMUNITY)
Admission: AD | Admit: 2013-06-23 | Discharge: 2013-06-23 | Disposition: A | Discharge status: Home / Self Care | Payer: Medicaid Other | Source: Ambulatory Visit | Attending: Obstetrics and Gynecology | Admitting: Obstetrics and Gynecology

## 2013-06-23 DIAGNOSIS — O219 Vomiting of pregnancy, unspecified: Secondary | ICD-10-CM

## 2013-06-23 DIAGNOSIS — O210 Mild hyperemesis gravidarum: Secondary | ICD-10-CM | POA: Insufficient documentation

## 2013-06-23 LAB — URINE MICROSCOPIC-ADD ON

## 2013-06-23 LAB — URINALYSIS, ROUTINE W REFLEX MICROSCOPIC
Bilirubin Urine: NEGATIVE
Glucose, UA: NEGATIVE mg/dL
Ketones, ur: NEGATIVE mg/dL
Nitrite: NEGATIVE
PH: 6 (ref 5.0–8.0)
Protein, ur: NEGATIVE mg/dL
Specific Gravity, Urine: 1.025 (ref 1.005–1.030)
Urobilinogen, UA: 0.2 mg/dL (ref 0.0–1.0)

## 2013-06-23 MED ORDER — LACTATED RINGERS IV BOLUS (SEPSIS)
1000.0000 mL | Freq: Once | INTRAVENOUS | Status: DC
  Administered 2013-06-23: 1000 mL via INTRAVENOUS

## 2013-06-23 MED ORDER — ONDANSETRON 8 MG/NS 50 ML IVPB
8.0000 mg | Freq: Once | INTRAVENOUS | Status: AC
  Administered 2013-06-23: 8 mg via INTRAVENOUS
  Filled 2013-06-23: qty 8

## 2013-06-23 MED ORDER — ONDANSETRON 8 MG PO TBDP: 8.0000 mg | ORAL_TABLET | Freq: Once | ORAL | Status: DC

## 2013-06-23 MED ORDER — ONDANSETRON 8 MG PO TBDP: 8.0000 mg | ORAL_TABLET | Freq: Three times a day (TID) | ORAL | Status: AC | PRN

## 2013-06-23 NOTE — MAU Provider Note (Signed)
Attestation of Attending Supervision of Advanced Practitioner (CNM/NP): Evaluation and management procedures were performed by the Advanced Practitioner under my supervision and collaboration.  I have reviewed the Advanced Practitioner's note and chart, and I agree with the management and plan.  Lyllian Gause 06/23/2013 3:02 PM   

## 2013-06-23 NOTE — MAU Provider Note (Signed)
S;tatesd feeling better, taking po's well.  O: VSS. Po fluids and crackers taken without vomiting.  A Nausea and vomiting of pregnancy  P: d/c home with Rx for zofran.

## 2013-06-23 NOTE — MAU Note (Signed)
Pt presents with complaints of nausea and vomiting since yesterday. States she has not been able to keep anything down

## 2013-06-23 NOTE — Progress Notes (Signed)
Notified that pt is keeping food and drink down. Will come see pt before discharge

## 2013-06-23 NOTE — MAU Provider Note (Signed)
  History     CSN: [id number]  Arrival date and time: 06/23/13 1002   None     Chief Complaint  Patient presents with  . Nausea  . Emesis   HPI Pt is a 18 y.o. G1P0 at 6736w2d who presents with severe nausea and vomiting for 3 days. She denies any diarrhea or sick contacts. Reports she has been unable to keep anything down including water and she has tried both zofran and phenergan which have not helped. She reports severe constant epigastric abdominal pain. Denies dysuria. She endorses good fetal movement. Denies contractions, bleeding or LOF. OB History   Grav Para Term Preterm Abortions TAB SAB Ect Mult Living   1               Past Medical History  Diagnosis Date  . Obesity     Past Surgical History  Procedure Laterality Date  . No past surgeries      Family History  Problem Relation Age of Onset  . Hypertension Father   . Diabetes Maternal Aunt   . Diabetes Maternal Uncle   . Diabetes Maternal Grandmother     History  Substance Use Topics  . Smoking status: Never Smoker   . Smokeless tobacco: Not on file  . Alcohol Use: No    Allergies: No Known Allergies  Prescriptions prior to admission  Medication Sig Dispense Refill  . acetaminophen (TYLENOL) 500 MG tablet Take 1,000 mg by mouth every 6 (six) hours as needed for fever.      . ondansetron (ZOFRAN-ODT) 4 MG disintegrating tablet Take 4 mg by mouth every 8 (eight) hours as needed for nausea or vomiting.      . Prenatal Vit-Min-FA-Fish Oil ([employer] PRENATAL GUMMY PO) Take 2 tablets by mouth daily.      . ranitidine (ZANTAC) 75 MG tablet Take 75 mg by mouth daily as needed (nausea).        ROS Physical Exam   Blood pressure 102/62, pulse 94, temperature 98.4 F (36.9 C), resp. rate 18, last menstrual period 02/07/2013.  Physical Exam  MAU Course  Procedures  MDM UA: large LE, small hgb, nitrite neg, spec grav 1.025  Assessment and Plan  Pt is a 18 y.o. G1P0 at 3036w2d who presents with severe nausea  and vomiting for 3 days. Likely gastroenteritis. 1L LR bolus + IV zofran, successful PO challenge, patient feeling better, discharge home with instructions to hydrate, continue zofran PRN.  Beverely Lowdamo, Elena 06/23/2013, 11:56 AM

## 2014-01-27 ENCOUNTER — Encounter (HOSPITAL_COMMUNITY): Payer: Self-pay | Admitting: *Deleted

## 2014-03-10 ENCOUNTER — Encounter (HOSPITAL_COMMUNITY): Payer: Self-pay | Admitting: *Deleted

## 2015-01-13 IMAGING — US US OB COMP LESS 14 WK
1 series · 14 of 28 positions shown · non-contrast
Comparison: No priors.

CLINICAL DATA: Vaginal bleeding.

EXAM:
TRANSVAGINAL OB ULTRASOUND; OBSTETRIC <14 WK ULTRASOUND
TECHNIQUE: Transvaginal ultrasound was performed for complete evaluation of the
gestation as well as the maternal uterus, adnexal regions, and
pelvic cul-de-sac.

[Series 1: us ob comp less 14 wk · 0.22mm/px · 39 acquisitions, 14 frames shown]
[im 2/39]
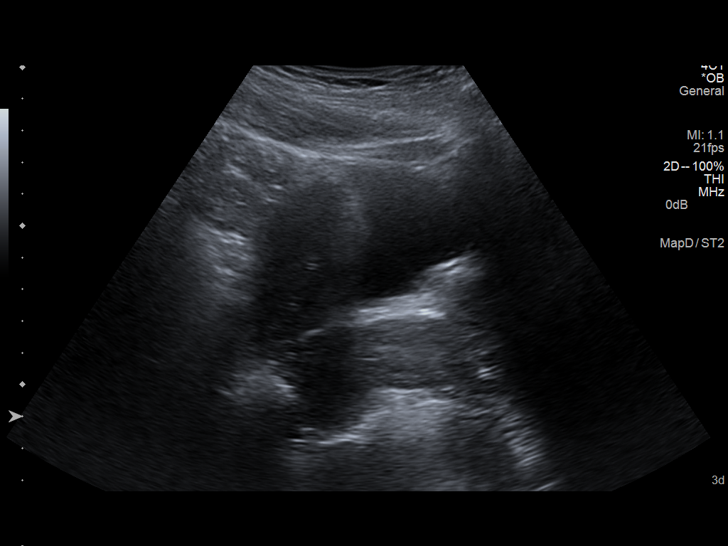
[im 5/39]
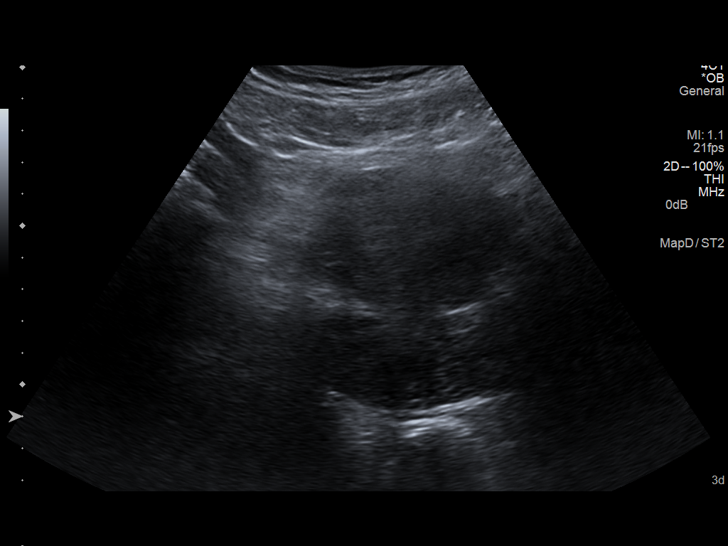
[im 8/39]
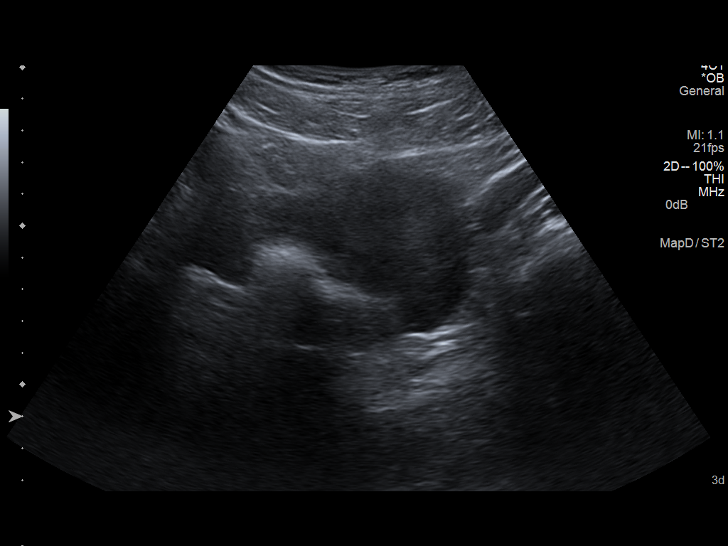
[im 10/39]
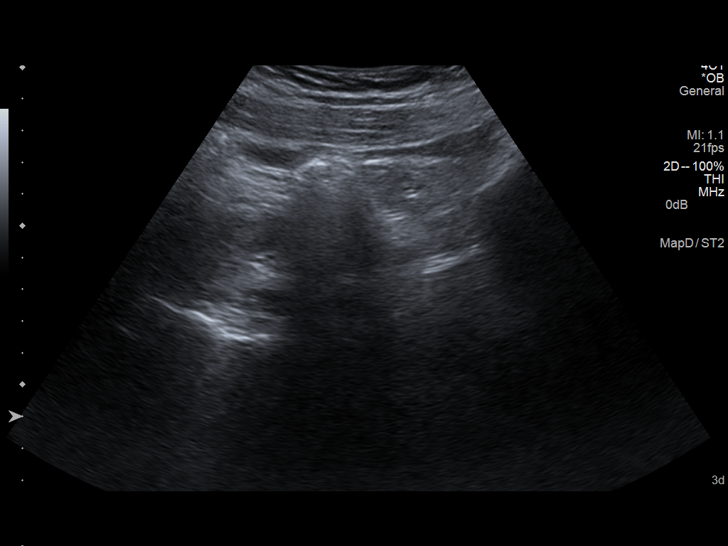
[im 13/39]
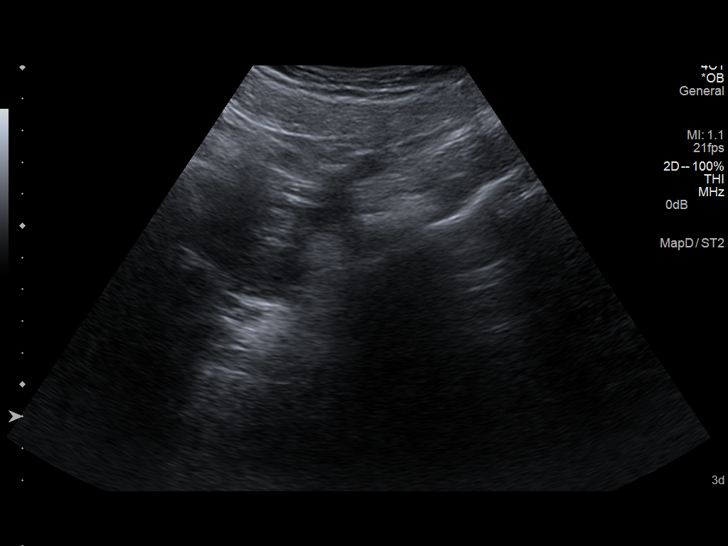
[im 16/39]
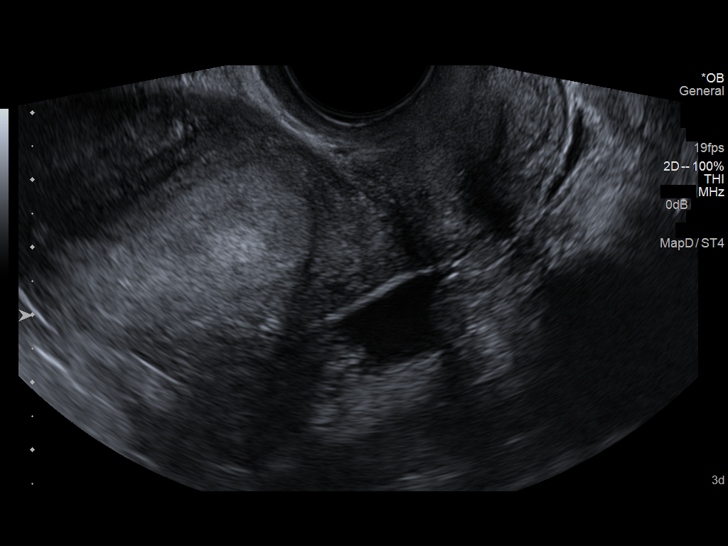
[im 19/39]
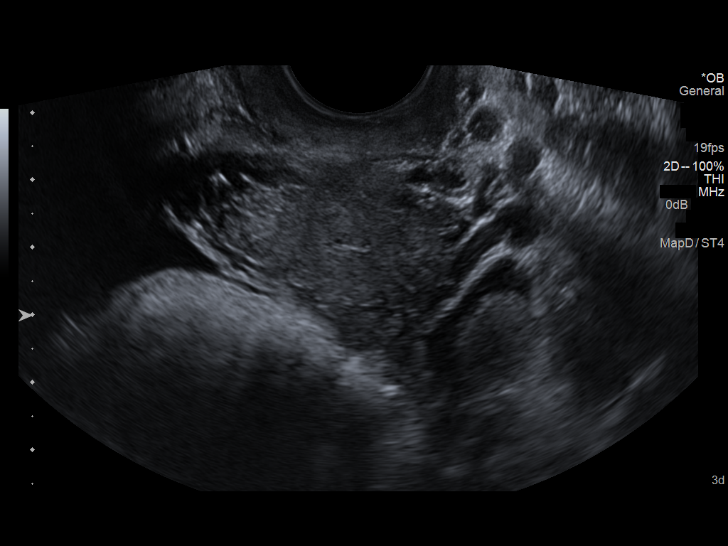
[im 22/39]
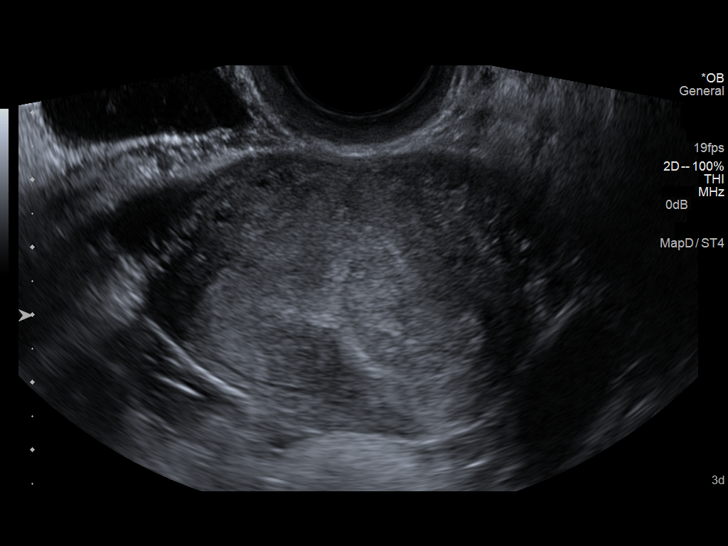
[im 24/39]
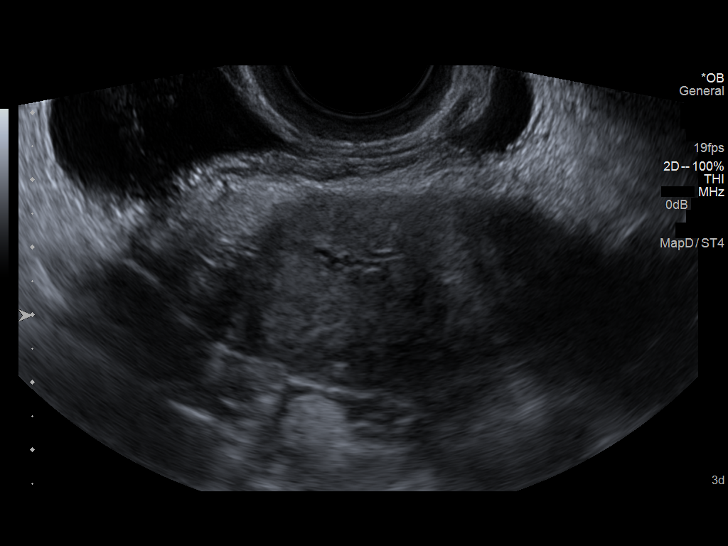
[im 27/39]
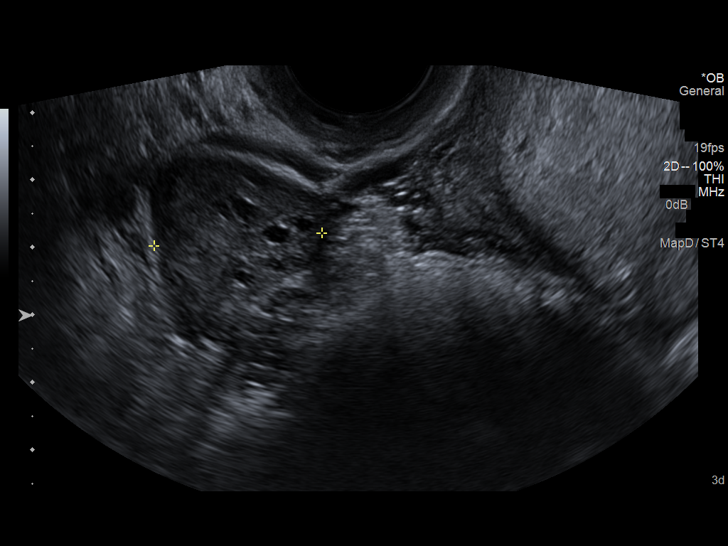
[im 30/39]
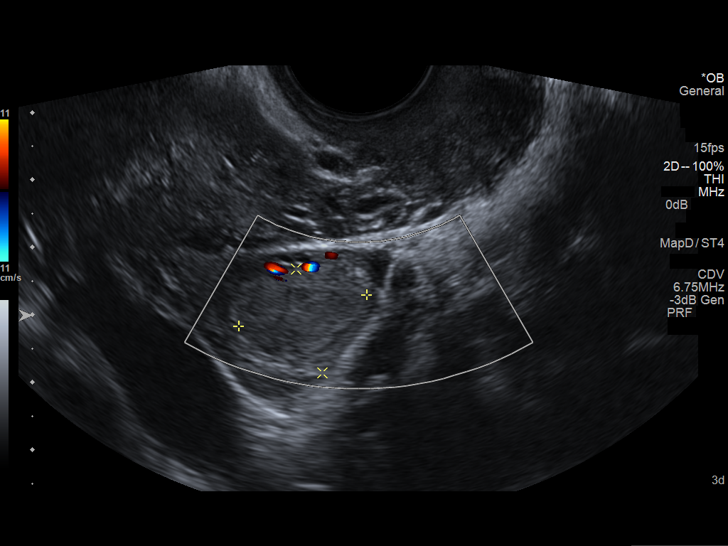
[im 33/39]
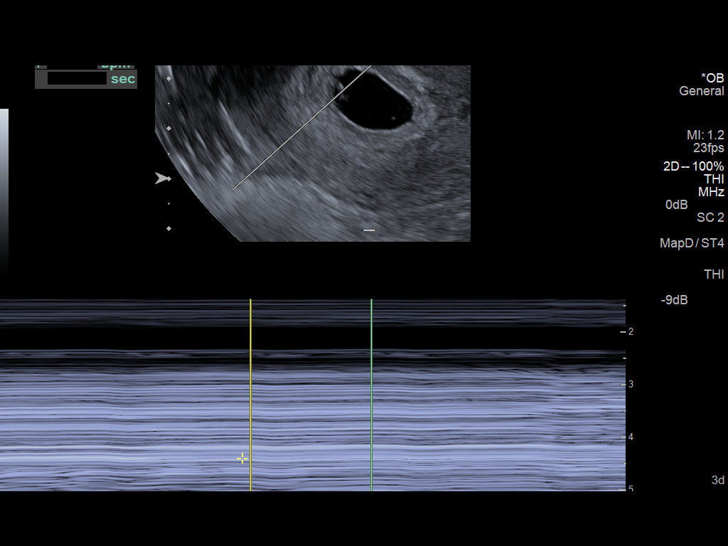
[im 36/39]
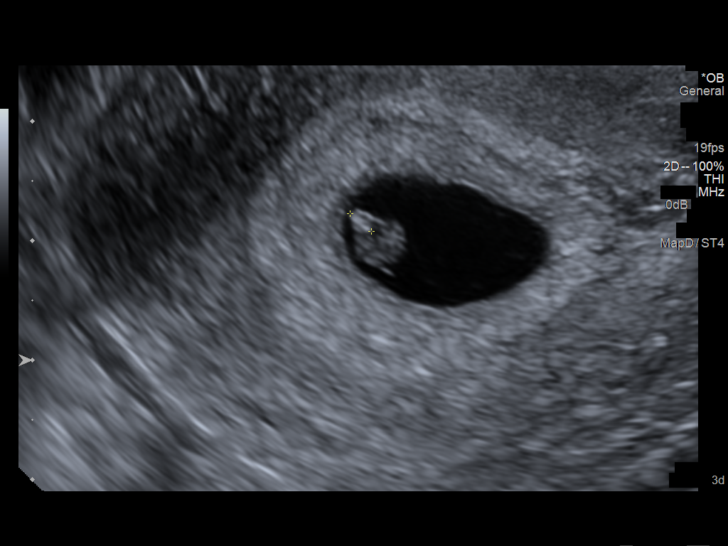
[im 39/39]
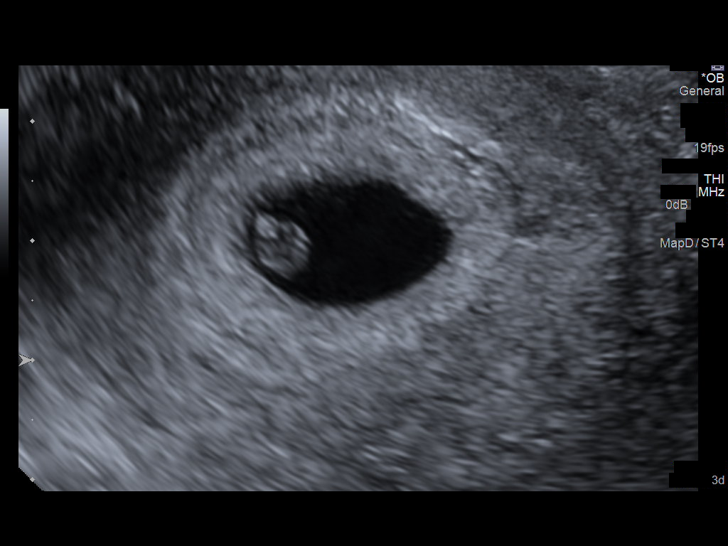

[14 of 28 positions shown; findings below may reference images not displayed]

FINDINGS: Intrauterine gestational sac: Single ovoid shaped gestational sac in
the fundal portion of the endometrial cavity.

Yolk sac:  Present.

Embryo:  Present.

Cardiac Activity: Present.

Heart Rate: 85 bpm

MSD:   mm    w     d

CRL:   2.3  mm   5 w 5 d                  US EDC: 11/08/2013

Maternal uterus/adnexae: No evidence of subchorionic hemorrhage at
this time. Probable degenerating corpus luteum cyst in the right
ovary incidentally noted. Left ovary is unremarkable in appearance.
Small volume of free fluid in the cul-de-sac.
IMPRESSION: 1. Single viable IUP with estimated gestational age of 5 weeks and 5
days and fetal heart rate of 85 beats per min.
2. Probable degenerating corpus luteum cyst in the right ovary
incidentally noted.
3. Small volume of free fluid in the cul-de-sac is presumably
physiologic.

## 2015-01-30 IMAGING — US US OB TRANSVAGINAL
1 series · 14 of 25 positions shown · non-contrast
Comparison: March 13, 2013

CLINICAL DATA: Pregnant, vaginal bleeding.  Assess for viability.

EXAM:
TRANSVAGINAL OB ULTRASOUND
TECHNIQUE: Transvaginal ultrasound was performed for complete evaluation of the
gestation as well as the maternal uterus, adnexal regions, and
pelvic cul-de-sac.

[Series 1: us ob transvaginal · 14 of 25 slices shown]
[im 1/25]
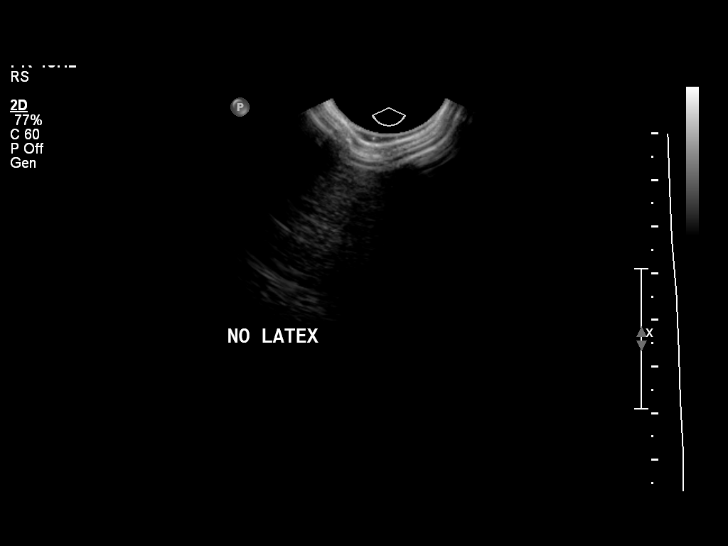
[im 3/25]
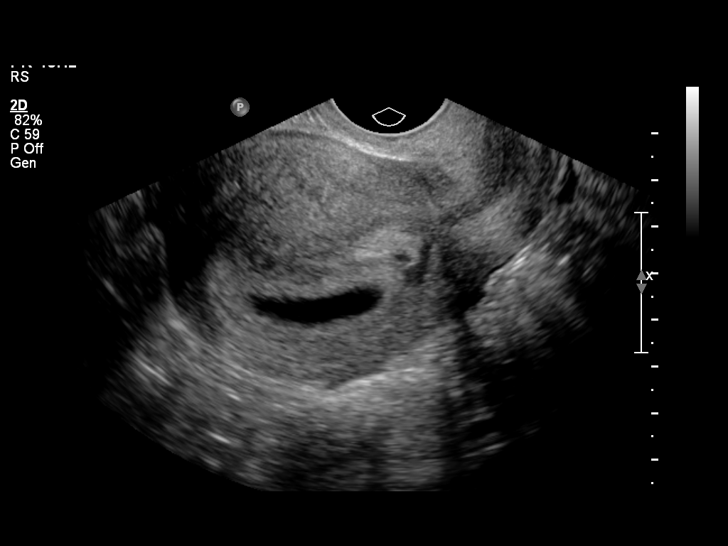
[im 5/25]
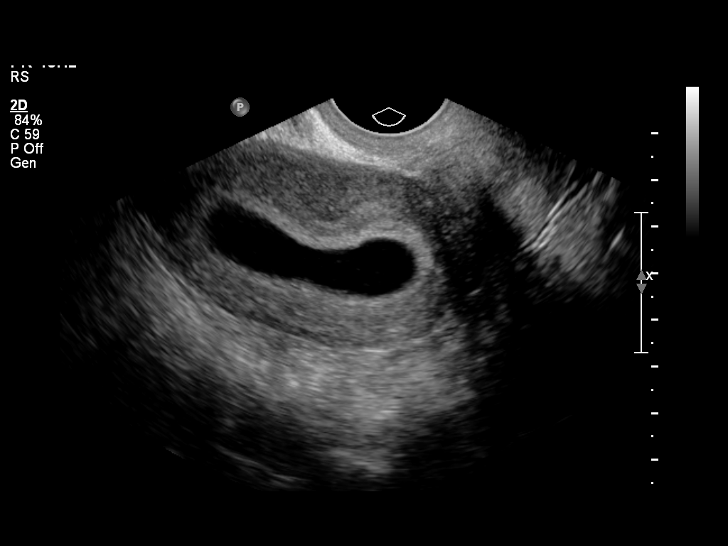
[im 7/25]
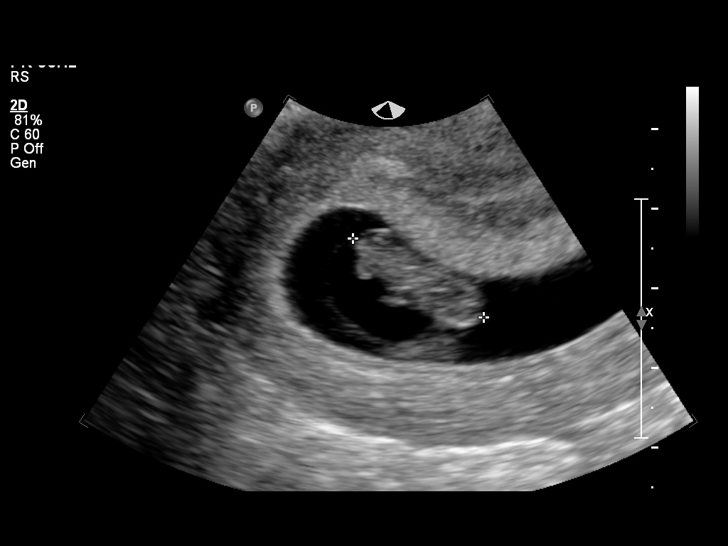
[im 9/25]
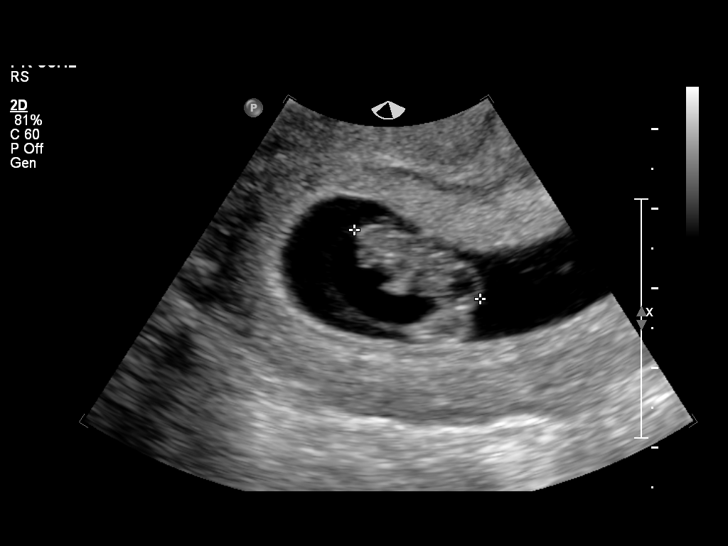
[im 10/25]
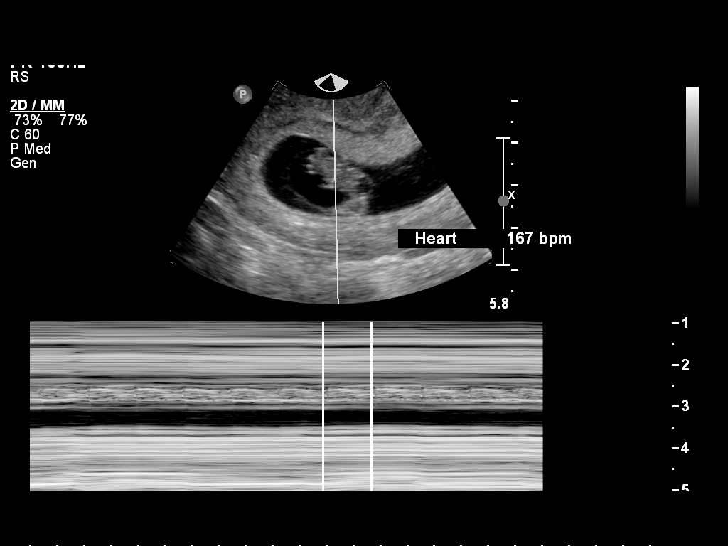
[im 12/25]
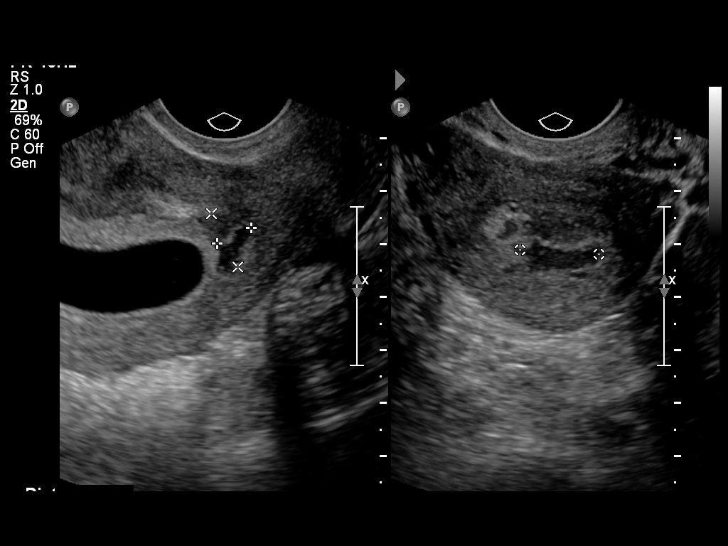
[im 14/25]
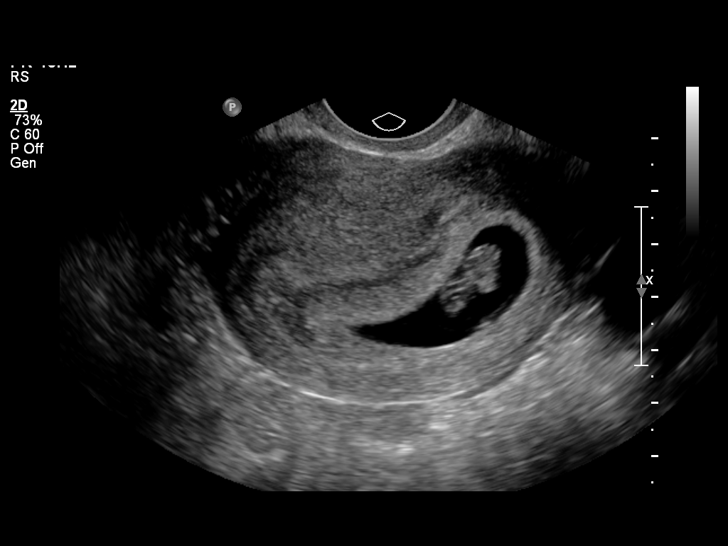
[im 16/25]
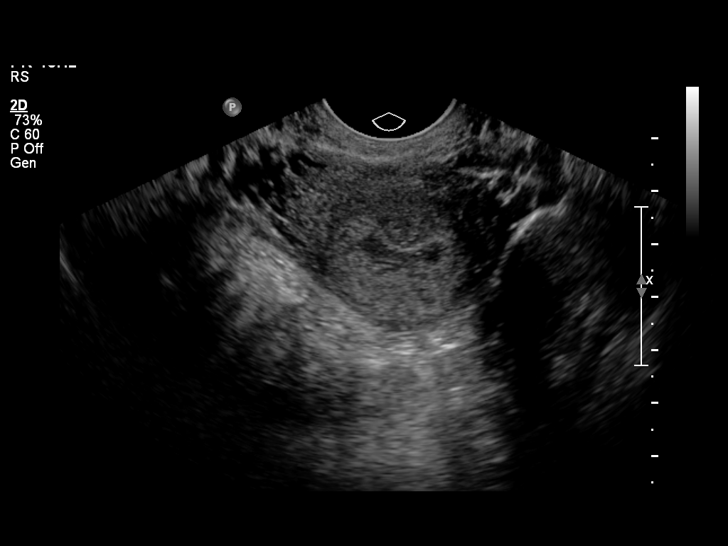
[im 17/25]
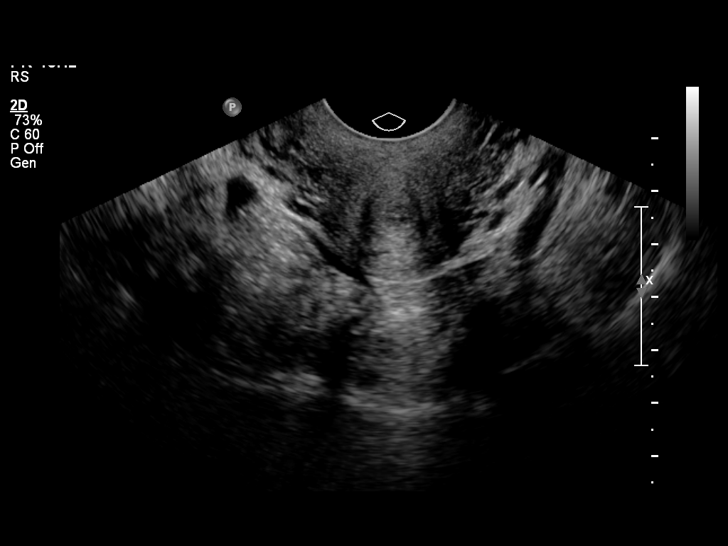
[im 19/25]
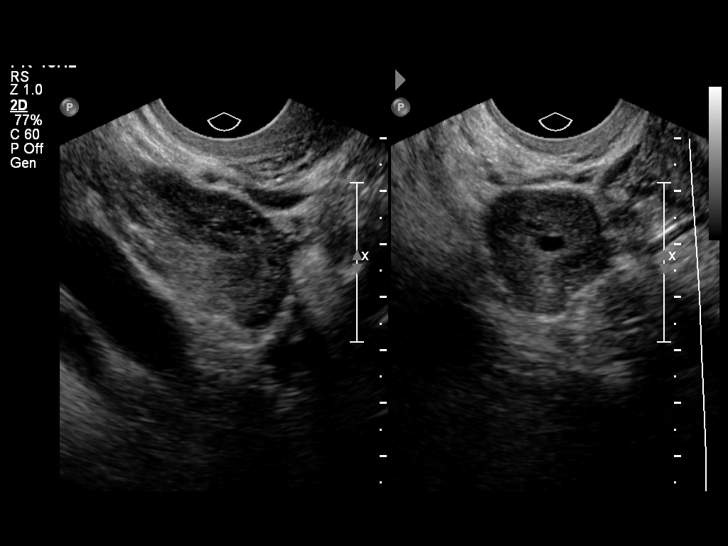
[im 21/25]
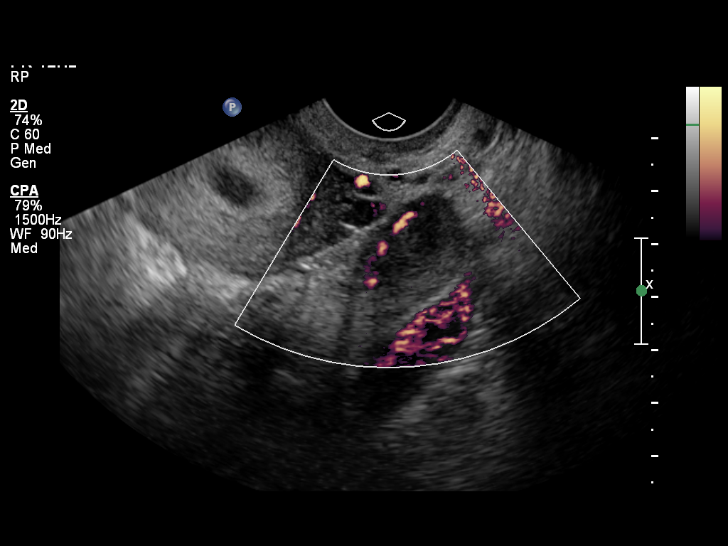
[im 23/25]
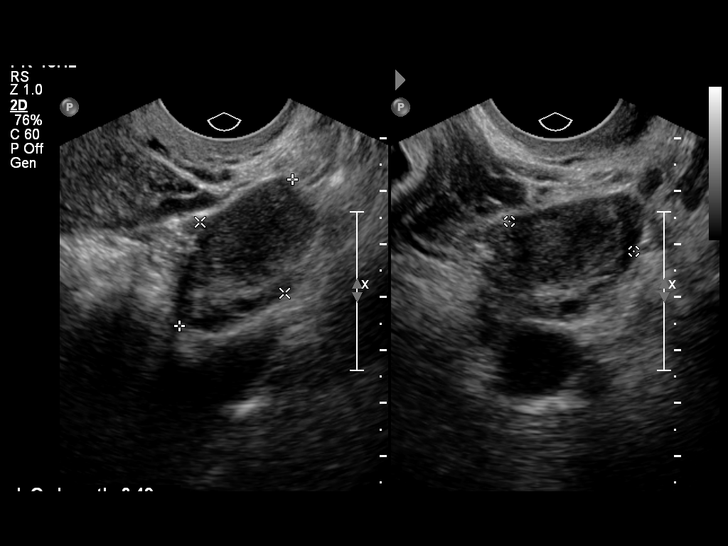
[im 25/25]
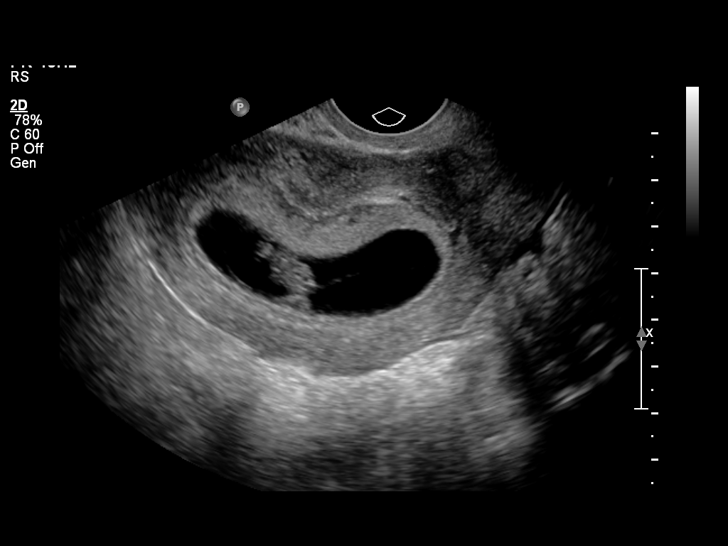

[14 of 25 positions shown; findings below may reference images not displayed]

FINDINGS: Intrauterine gestational sac: Visualized/normal in shape.

Yolk sac:  Present

Embryo:  Present

Cardiac Activity: Present

Heart Rate: 167 bpm

CRL:   1.86 cm    8 w 3 d                  US EDC: November 06, 2013

Maternal uterus/adnexae: There is a small subchorionic hemorrhage
measuring 0.7 x 1.1 x 1.5 cm. The bilateral ovaries are normal.
Trace free fluid is identified in the pelvis.
IMPRESSION: Single live intrauterine gestation measuring to 8 weeks and 3 days.
Small subchorionic hemorrhage.
